# Patient Record
Sex: Female | Born: 2000 | Race: White | Hispanic: Yes | Marital: Single | Smoking: Never smoker
Health system: Southern US, Community
[De-identification: ages and names within clinical notes are randomized; demographics above are authoritative.]

## PROBLEM LIST (undated history)

## (undated) ENCOUNTER — Ambulatory Visit (HOSPITAL_COMMUNITY): Discharge status: Absent without leave | Payer: Medicaid Other

## (undated) DIAGNOSIS — E785 Hyperlipidemia, unspecified: Secondary | ICD-10-CM

## (undated) HISTORY — DX: Hyperlipidemia, unspecified: E78.5

---
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
## 2021-01-17 DIAGNOSIS — Z419 Encounter for procedure for purposes other than remedying health state, unspecified: Secondary | ICD-10-CM | POA: Diagnosis not present

## 2021-02-17 DIAGNOSIS — Z419 Encounter for procedure for purposes other than remedying health state, unspecified: Secondary | ICD-10-CM | POA: Diagnosis not present

## 2021-03-19 DIAGNOSIS — Z419 Encounter for procedure for purposes other than remedying health state, unspecified: Secondary | ICD-10-CM | POA: Diagnosis not present

## 2021-03-24 DIAGNOSIS — Z130 Encounter for screening for diseases of the blood and blood-forming organs and certain disorders involving the immune mechanism: Secondary | ICD-10-CM | POA: Diagnosis not present

## 2021-03-24 DIAGNOSIS — Z719 Counseling, unspecified: Secondary | ICD-10-CM | POA: Diagnosis not present

## 2021-03-24 DIAGNOSIS — Z713 Dietary counseling and surveillance: Secondary | ICD-10-CM | POA: Diagnosis not present

## 2021-03-24 DIAGNOSIS — Z1321 Encounter for screening for nutritional disorder: Secondary | ICD-10-CM | POA: Diagnosis not present

## 2021-03-24 DIAGNOSIS — Z113 Encounter for screening for infections with a predominantly sexual mode of transmission: Secondary | ICD-10-CM | POA: Diagnosis not present

## 2021-03-24 DIAGNOSIS — Z0000 Encounter for general adult medical examination without abnormal findings: Secondary | ICD-10-CM | POA: Diagnosis not present

## 2021-03-24 DIAGNOSIS — Z13220 Encounter for screening for lipoid disorders: Secondary | ICD-10-CM | POA: Diagnosis not present

## 2021-03-24 DIAGNOSIS — Z68.41 Body mass index (BMI) pediatric, 85th percentile to less than 95th percentile for age: Secondary | ICD-10-CM | POA: Diagnosis not present

## 2021-04-19 DIAGNOSIS — Z419 Encounter for procedure for purposes other than remedying health state, unspecified: Secondary | ICD-10-CM | POA: Diagnosis not present

## 2021-05-19 DIAGNOSIS — Z419 Encounter for procedure for purposes other than remedying health state, unspecified: Secondary | ICD-10-CM | POA: Diagnosis not present

## 2021-06-09 ENCOUNTER — Emergency Department (HOSPITAL_COMMUNITY)
Admission: EM | Admit: 2021-06-09 | Discharge: 2021-06-09 | Disposition: A | Discharge status: Home / Self Care | Payer: Medicaid Other | Attending: Emergency Medicine | Admitting: Emergency Medicine

## 2021-06-09 ENCOUNTER — Encounter (HOSPITAL_COMMUNITY): Payer: Self-pay

## 2021-06-09 ENCOUNTER — Other Ambulatory Visit: Payer: Self-pay

## 2021-06-09 ENCOUNTER — Emergency Department (HOSPITAL_COMMUNITY): Payer: Medicaid Other

## 2021-06-09 DIAGNOSIS — R0789 Other chest pain: Secondary | ICD-10-CM | POA: Insufficient documentation

## 2021-06-09 DIAGNOSIS — M546 Pain in thoracic spine: Secondary | ICD-10-CM | POA: Insufficient documentation

## 2021-06-09 DIAGNOSIS — Y92410 Unspecified street and highway as the place of occurrence of the external cause: Secondary | ICD-10-CM | POA: Diagnosis not present

## 2021-06-09 DIAGNOSIS — S39012A Strain of muscle, fascia and tendon of lower back, initial encounter: Secondary | ICD-10-CM

## 2021-06-09 DIAGNOSIS — M545 Low back pain, unspecified: Secondary | ICD-10-CM | POA: Insufficient documentation

## 2021-06-09 DIAGNOSIS — S29012A Strain of muscle and tendon of back wall of thorax, initial encounter: Secondary | ICD-10-CM | POA: Diagnosis not present

## 2021-06-09 DIAGNOSIS — R079 Chest pain, unspecified: Secondary | ICD-10-CM | POA: Diagnosis not present

## 2021-06-09 LAB — PREGNANCY, URINE: Preg Test, Ur: NEGATIVE

## 2021-06-09 MED ORDER — IBUPROFEN 400 MG PO TABS
600.0000 mg | ORAL_TABLET | Freq: Once | ORAL | Status: AC
  Administered 2021-06-09: 20:00:00 600 mg via ORAL
  Filled 2021-06-09: qty 1

## 2021-06-09 MED ORDER — CYCLOBENZAPRINE HCL 5 MG PO TABS: 5.0000 mg | ORAL_TABLET | Freq: Three times a day (TID) | ORAL | 0 refills | Status: AC | PRN

## 2021-06-09 MED ORDER — CYCLOBENZAPRINE HCL 10 MG PO TABS
5.0000 mg | ORAL_TABLET | Freq: Once | ORAL | Status: AC
  Administered 2021-06-09: 20:00:00 5 mg via ORAL
  Filled 2021-06-09: qty 1

## 2021-06-09 MED ORDER — LIDOCAINE 5 % EX PTCH: 1.0000 | MEDICATED_PATCH | CUTANEOUS | 0 refills | Status: AC
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
## 2021-06-09 NOTE — Discharge Instructions (Signed)
Your x-rays did not show any fracture  You are expected to be stiff and sore for several days  Take Tylenol or Motrin for pain  Take Flexeril for muscle spasm and you can try lidocaine patch  See your doctor for follow-up  Return to ER if you have worse back pain, chest pain, abdominal pain, trouble walking
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
## 2021-06-19 DIAGNOSIS — Z419 Encounter for procedure for purposes other than remedying health state, unspecified: Secondary | ICD-10-CM | POA: Diagnosis not present

## 2021-07-20 DIAGNOSIS — Z419 Encounter for procedure for purposes other than remedying health state, unspecified: Secondary | ICD-10-CM | POA: Diagnosis not present

## 2021-08-17 DIAGNOSIS — Z419 Encounter for procedure for purposes other than remedying health state, unspecified: Secondary | ICD-10-CM | POA: Diagnosis not present

## 2021-09-17 DIAGNOSIS — Z419 Encounter for procedure for purposes other than remedying health state, unspecified: Secondary | ICD-10-CM | POA: Diagnosis not present

## 2021-10-17 DIAGNOSIS — Z419 Encounter for procedure for purposes other than remedying health state, unspecified: Secondary | ICD-10-CM | POA: Diagnosis not present

## 2021-11-17 DIAGNOSIS — Z419 Encounter for procedure for purposes other than remedying health state, unspecified: Secondary | ICD-10-CM | POA: Diagnosis not present

## 2021-12-17 DIAGNOSIS — Z419 Encounter for procedure for purposes other than remedying health state, unspecified: Secondary | ICD-10-CM | POA: Diagnosis not present

## 2022-01-17 DIAGNOSIS — Z419 Encounter for procedure for purposes other than remedying health state, unspecified: Secondary | ICD-10-CM | POA: Diagnosis not present

## 2022-02-17 DIAGNOSIS — Z419 Encounter for procedure for purposes other than remedying health state, unspecified: Secondary | ICD-10-CM | POA: Diagnosis not present

## 2022-03-03 DIAGNOSIS — M25561 Pain in right knee: Secondary | ICD-10-CM | POA: Diagnosis not present

## 2022-03-19 DIAGNOSIS — Z419 Encounter for procedure for purposes other than remedying health state, unspecified: Secondary | ICD-10-CM | POA: Diagnosis not present

## 2022-03-24 DIAGNOSIS — Z114 Encounter for screening for human immunodeficiency virus [HIV]: Secondary | ICD-10-CM | POA: Diagnosis not present

## 2022-03-24 DIAGNOSIS — Z7182 Exercise counseling: Secondary | ICD-10-CM | POA: Diagnosis not present

## 2022-03-24 DIAGNOSIS — Z131 Encounter for screening for diabetes mellitus: Secondary | ICD-10-CM | POA: Diagnosis not present

## 2022-03-24 DIAGNOSIS — Z713 Dietary counseling and surveillance: Secondary | ICD-10-CM | POA: Diagnosis not present

## 2022-03-24 DIAGNOSIS — Z1159 Encounter for screening for other viral diseases: Secondary | ICD-10-CM | POA: Diagnosis not present

## 2022-03-24 DIAGNOSIS — Z0000 Encounter for general adult medical examination without abnormal findings: Secondary | ICD-10-CM | POA: Diagnosis not present

## 2022-03-24 DIAGNOSIS — Z13220 Encounter for screening for lipoid disorders: Secondary | ICD-10-CM | POA: Diagnosis not present

## 2022-03-24 DIAGNOSIS — N920 Excessive and frequent menstruation with regular cycle: Secondary | ICD-10-CM | POA: Diagnosis not present

## 2022-03-24 DIAGNOSIS — Z8349 Family history of other endocrine, nutritional and metabolic diseases: Secondary | ICD-10-CM | POA: Diagnosis not present

## 2022-03-24 DIAGNOSIS — Z8639 Personal history of other endocrine, nutritional and metabolic disease: Secondary | ICD-10-CM | POA: Diagnosis not present

## 2022-03-24 DIAGNOSIS — Z113 Encounter for screening for infections with a predominantly sexual mode of transmission: Secondary | ICD-10-CM | POA: Diagnosis not present

## 2022-04-19 DIAGNOSIS — Z419 Encounter for procedure for purposes other than remedying health state, unspecified: Secondary | ICD-10-CM | POA: Diagnosis not present

## 2022-05-19 DIAGNOSIS — Z419 Encounter for procedure for purposes other than remedying health state, unspecified: Secondary | ICD-10-CM | POA: Diagnosis not present

## 2022-06-08 ENCOUNTER — Ambulatory Visit (HOSPITAL_COMMUNITY): Payer: Self-pay | Admitting: Mental Health

## 2022-06-19 DIAGNOSIS — Z419 Encounter for procedure for purposes other than remedying health state, unspecified: Secondary | ICD-10-CM | POA: Diagnosis not present

## 2022-07-20 DIAGNOSIS — Z419 Encounter for procedure for purposes other than remedying health state, unspecified: Secondary | ICD-10-CM | POA: Diagnosis not present

## 2022-08-10 DIAGNOSIS — Q381 Ankyloglossia: Secondary | ICD-10-CM | POA: Diagnosis not present

## 2022-08-18 DIAGNOSIS — Z419 Encounter for procedure for purposes other than remedying health state, unspecified: Secondary | ICD-10-CM | POA: Diagnosis not present

## 2022-09-12 DIAGNOSIS — Q381 Ankyloglossia: Secondary | ICD-10-CM | POA: Diagnosis not present

## 2022-09-18 DIAGNOSIS — Z419 Encounter for procedure for purposes other than remedying health state, unspecified: Secondary | ICD-10-CM | POA: Diagnosis not present

## 2022-10-18 DIAGNOSIS — Z419 Encounter for procedure for purposes other than remedying health state, unspecified: Secondary | ICD-10-CM | POA: Diagnosis not present
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
## 2022-11-18 DIAGNOSIS — Z419 Encounter for procedure for purposes other than remedying health state, unspecified: Secondary | ICD-10-CM | POA: Diagnosis not present

## 2022-12-18 DIAGNOSIS — Z419 Encounter for procedure for purposes other than remedying health state, unspecified: Secondary | ICD-10-CM | POA: Diagnosis not present

## 2023-01-18 DIAGNOSIS — Z419 Encounter for procedure for purposes other than remedying health state, unspecified: Secondary | ICD-10-CM | POA: Diagnosis not present

## 2023-02-18 DIAGNOSIS — Z419 Encounter for procedure for purposes other than remedying health state, unspecified: Secondary | ICD-10-CM | POA: Diagnosis not present

## 2023-03-20 DIAGNOSIS — Z419 Encounter for procedure for purposes other than remedying health state, unspecified: Secondary | ICD-10-CM | POA: Diagnosis not present

## 2023-04-20 DIAGNOSIS — Z419 Encounter for procedure for purposes other than remedying health state, unspecified: Secondary | ICD-10-CM | POA: Diagnosis not present

## 2023-05-20 DIAGNOSIS — Z419 Encounter for procedure for purposes other than remedying health state, unspecified: Secondary | ICD-10-CM | POA: Diagnosis not present

## 2023-06-20 DIAGNOSIS — Z419 Encounter for procedure for purposes other than remedying health state, unspecified: Secondary | ICD-10-CM | POA: Diagnosis not present

## 2023-07-21 DIAGNOSIS — Z419 Encounter for procedure for purposes other than remedying health state, unspecified: Secondary | ICD-10-CM | POA: Diagnosis not present

## 2023-08-18 DIAGNOSIS — Z419 Encounter for procedure for purposes other than remedying health state, unspecified: Secondary | ICD-10-CM | POA: Diagnosis not present

## 2023-08-22 DIAGNOSIS — R21 Rash and other nonspecific skin eruption: Secondary | ICD-10-CM | POA: Diagnosis not present

## 2023-08-22 DIAGNOSIS — J069 Acute upper respiratory infection, unspecified: Secondary | ICD-10-CM | POA: Diagnosis not present

## 2023-09-29 DIAGNOSIS — Z419 Encounter for procedure for purposes other than remedying health state, unspecified: Secondary | ICD-10-CM | POA: Diagnosis not present

## 2023-10-29 DIAGNOSIS — Z419 Encounter for procedure for purposes other than remedying health state, unspecified: Secondary | ICD-10-CM | POA: Diagnosis not present

## 2023-10-31 DIAGNOSIS — Q381 Ankyloglossia: Secondary | ICD-10-CM | POA: Diagnosis not present

## 2023-10-31 DIAGNOSIS — L301 Dyshidrosis [pompholyx]: Secondary | ICD-10-CM | POA: Diagnosis not present

## 2023-10-31 DIAGNOSIS — F988 Other specified behavioral and emotional disorders with onset usually occurring in childhood and adolescence: Secondary | ICD-10-CM | POA: Diagnosis not present

## 2023-11-29 DIAGNOSIS — Z419 Encounter for procedure for purposes other than remedying health state, unspecified: Secondary | ICD-10-CM | POA: Diagnosis not present

## 2023-12-11 DIAGNOSIS — Q381 Ankyloglossia: Secondary | ICD-10-CM | POA: Diagnosis not present

## 2023-12-29 DIAGNOSIS — Z419 Encounter for procedure for purposes other than remedying health state, unspecified: Secondary | ICD-10-CM | POA: Diagnosis not present

## 2024-01-29 DIAGNOSIS — Z419 Encounter for procedure for purposes other than remedying health state, unspecified: Secondary | ICD-10-CM | POA: Diagnosis not present

## 2024-02-29 DIAGNOSIS — Z419 Encounter for procedure for purposes other than remedying health state, unspecified: Secondary | ICD-10-CM | POA: Diagnosis not present

## 2024-05-30 DIAGNOSIS — Z419 Encounter for procedure for purposes other than remedying health state, unspecified: Secondary | ICD-10-CM | POA: Diagnosis not present
